# Patient Record
Sex: Male | Born: 1990 | Race: White | Hispanic: No | Marital: Single | Smoking: Current every day smoker
Health system: Southern US, Community
[De-identification: ages and names within clinical notes are randomized; demographics above are authoritative.]

## PROBLEM LIST (undated history)

## (undated) HISTORY — PX: CHOLECYSTECTOMY: SHX55

---

## 2014-12-15 ENCOUNTER — Emergency Department: Payer: Self-pay | Admitting: Internal Medicine

## 2015-05-22 ENCOUNTER — Encounter: Payer: Self-pay | Admitting: Medical Oncology

## 2015-05-22 ENCOUNTER — Emergency Department
Admission: EM | Admit: 2015-05-22 | Discharge: 2015-05-22 | Discharge status: Against Medical Advice | Payer: Self-pay | Attending: Emergency Medicine | Admitting: Emergency Medicine

## 2015-05-22 DIAGNOSIS — Z3A20 20 weeks gestation of pregnancy: Secondary | ICD-10-CM | POA: Insufficient documentation

## 2015-05-22 DIAGNOSIS — F17210 Nicotine dependence, cigarettes, uncomplicated: Secondary | ICD-10-CM | POA: Insufficient documentation

## 2015-05-22 DIAGNOSIS — O99332 Smoking (tobacco) complicating pregnancy, second trimester: Secondary | ICD-10-CM | POA: Insufficient documentation

## 2015-05-22 DIAGNOSIS — R109 Unspecified abdominal pain: Secondary | ICD-10-CM | POA: Insufficient documentation

## 2015-05-22 DIAGNOSIS — O9989 Other specified diseases and conditions complicating pregnancy, childbirth and the puerperium: Secondary | ICD-10-CM | POA: Insufficient documentation

## 2015-05-22 DIAGNOSIS — M545 Low back pain: Secondary | ICD-10-CM | POA: Insufficient documentation

## 2015-05-22 NOTE — ED Notes (Signed)
Pt to triage with c/o lower back pain and lower abd pain that began last night. Pt reports that she is approx [redacted] weeks pregnant. pts 4th pregnancy. Pt reports that she is also having some pink colored vaginal discharge.

## 2015-10-13 IMAGING — CT CT MAXILLOFACIAL WITHOUT CONTRAST
3 series · 17 of 47 positions shown, 20 images · non-contrast
Comparison: None.

CLINICAL DATA: Right lower eyelid laceration, hit with crow bar

EXAM:
CT MAXILLOFACIAL WITHOUT CONTRAST
TECHNIQUE: Multidetector CT imaging of the maxillofacial structures was
performed. Multiplanar CT image reconstructions were also generated.
A small metallic BB was placed on the right temple in order to
reliably differentiate right from left.

[Series 2: max soft · axial · 0.37mm/px · z∈[-104,+46]mm · 11 of 87 slices shown, 14 images]
[im 6/87  brain]
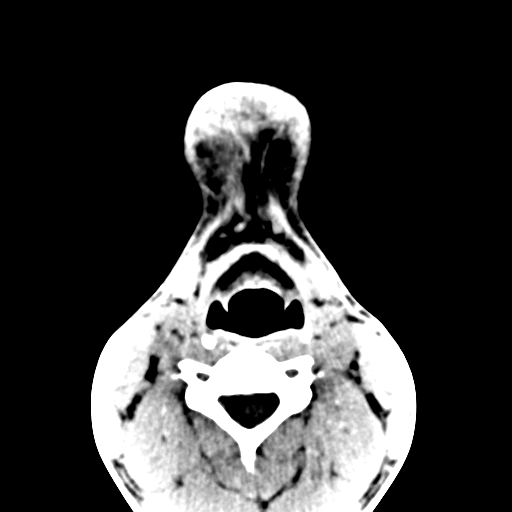
[im 6/87  bone]
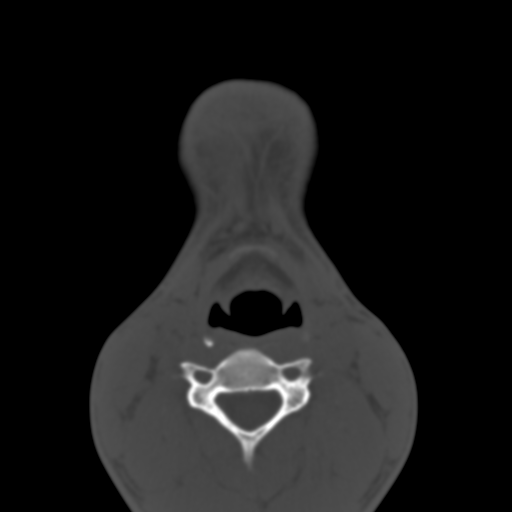
[im 12/87  bone]
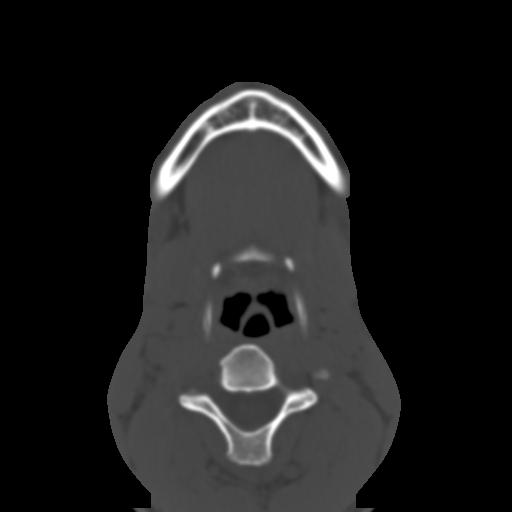
[im 21/87  bone]
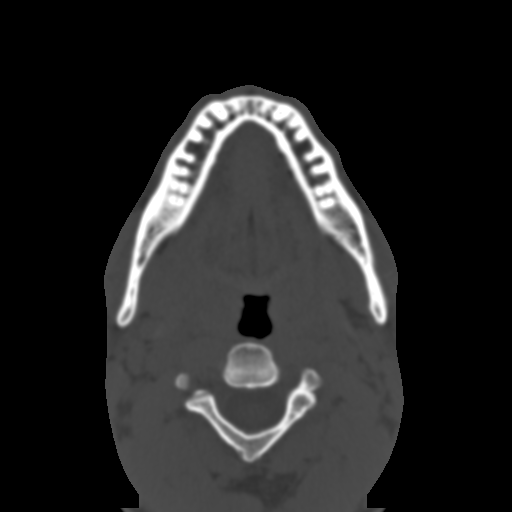
[im 27/87  bone]
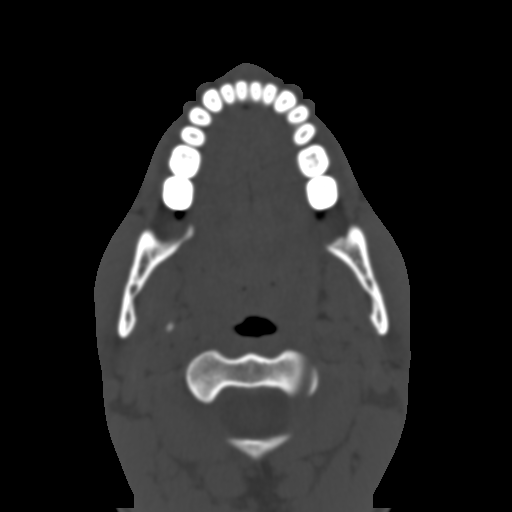
[im 36/87  brain]
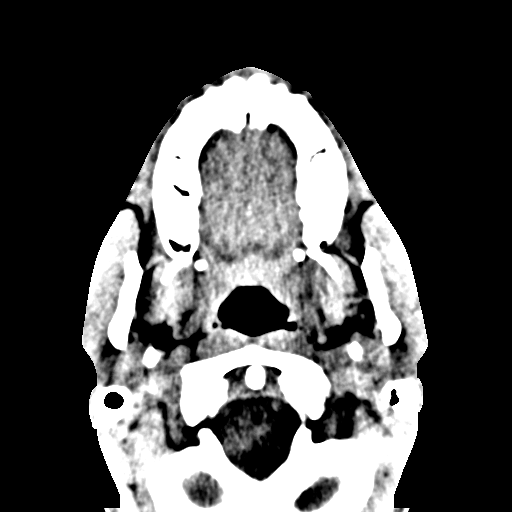
[im 36/87  bone]
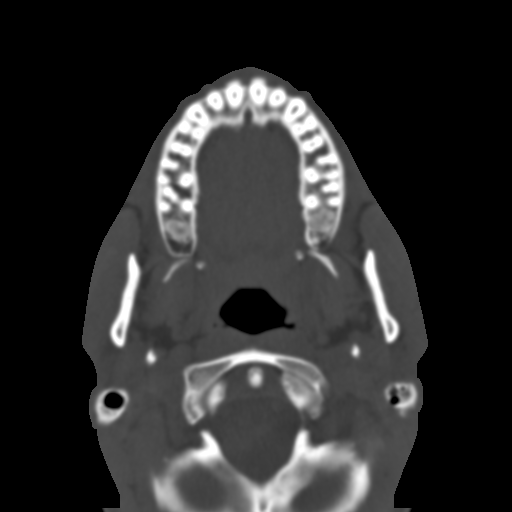
[im 45/87  bone]
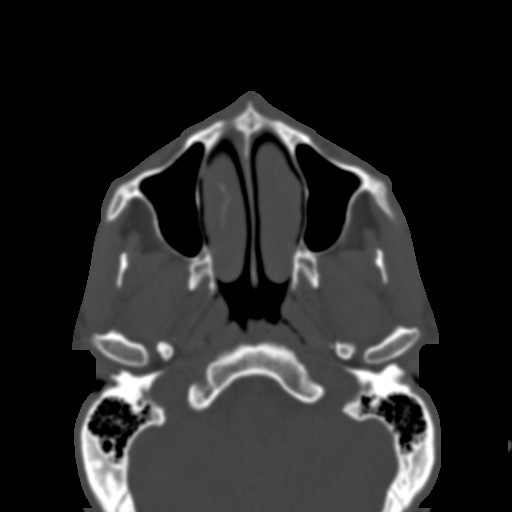
[im 51/87  bone]
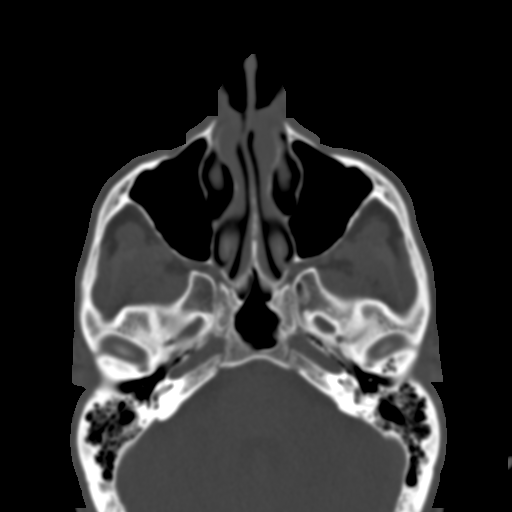
[im 60/87  bone]
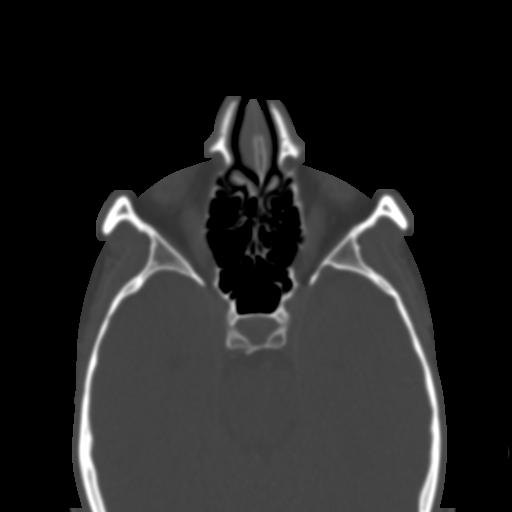
[im 66/87  brain]
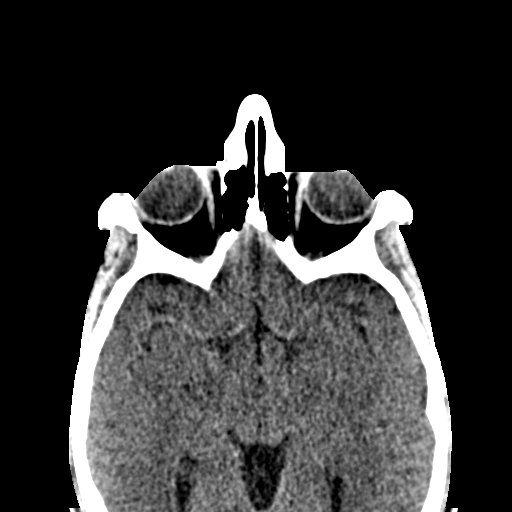
[im 66/87  bone]
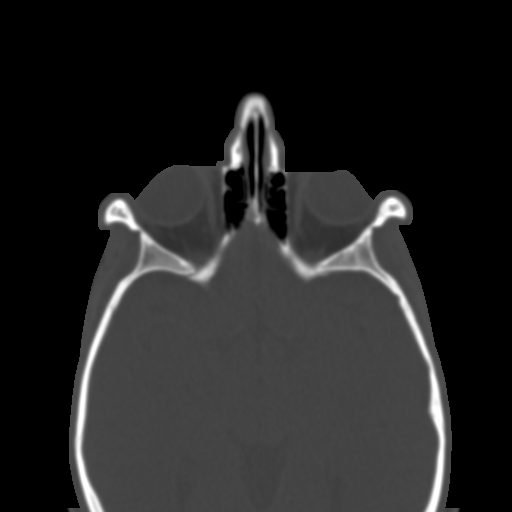
[im 75/87  bone]
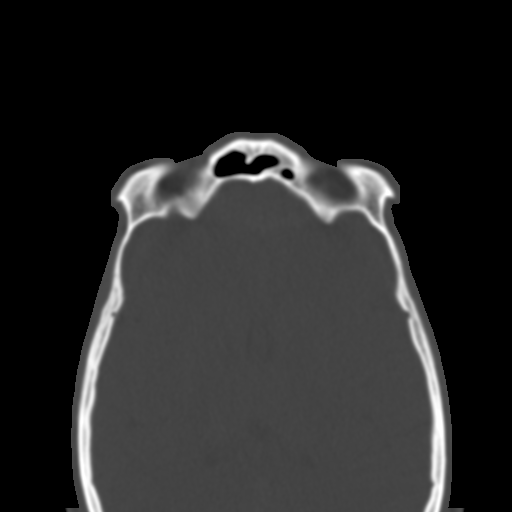
[im 81/87  bone]
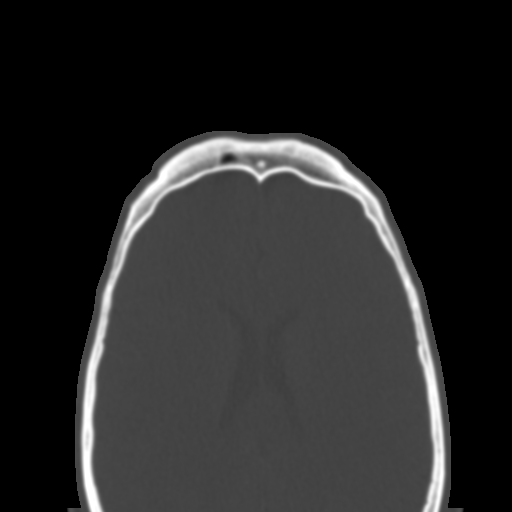

[Series 4: coronal soft · coronal · 0.36mm/px · 3 of 78 slices shown]
[im 26/78  bone]
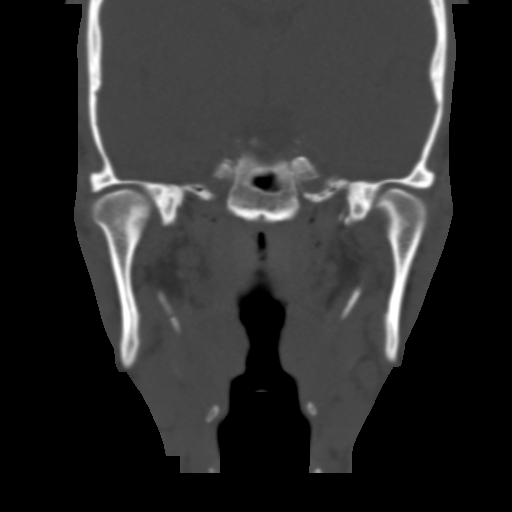
[im 35/78  bone]
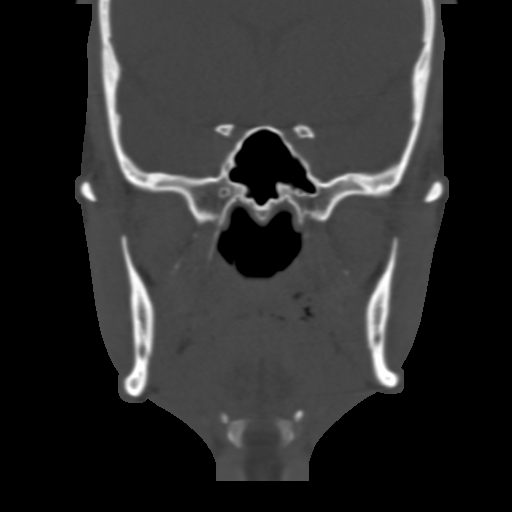
[im 43/78  bone]
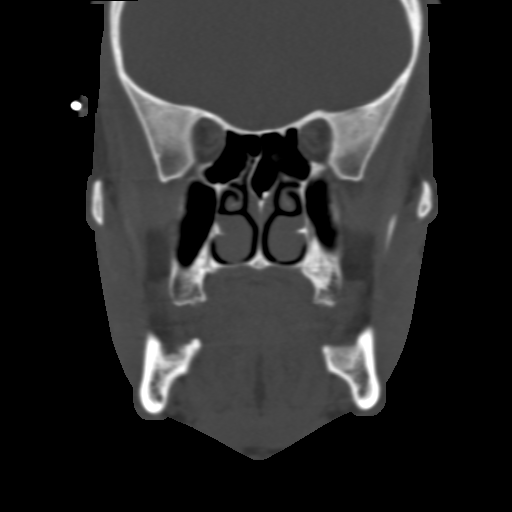

[Series 5: sagittal soft · sagittal · 0.36mm/px · 3 of 75 slices shown]
[im 25/75  bone]
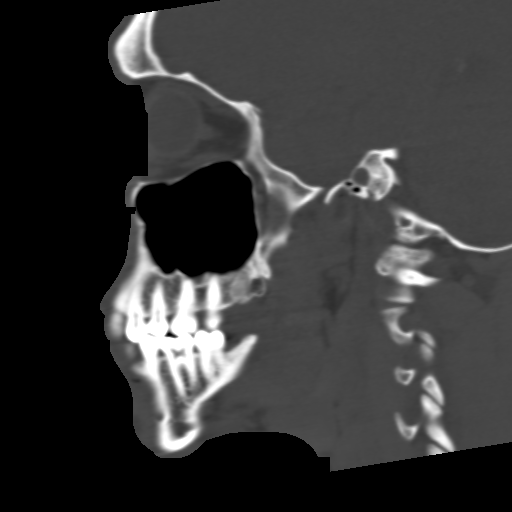
[im 38/75  bone]
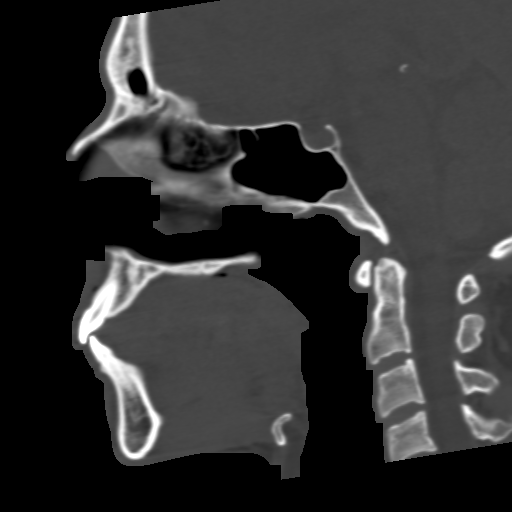
[im 50/75  bone]
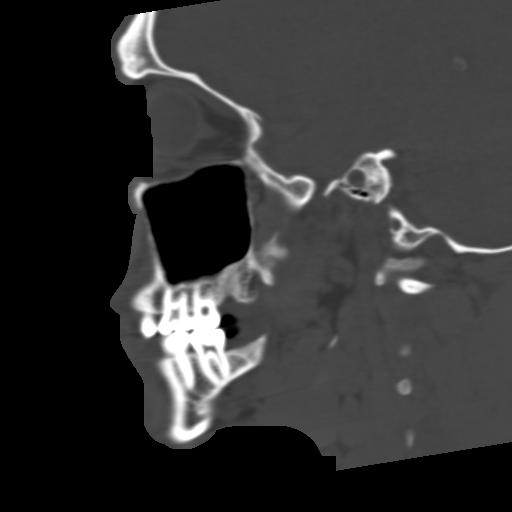

[17 of 47 positions shown; findings below may reference images not displayed]

FINDINGS: No evidence of maxillofacial fracture.

The visualized paranasal sinuses are essentially clear. The mastoid
air cells are unopacified.

The bilateral orbits, including the globes and retroconal soft
tissues, are within normal limits.

Cervical spine is within normal limits to C4.

Visualized brain parenchyma is unremarkable.
IMPRESSION: Normal maxillofacial CT.
